# Patient Record
Sex: Female | Born: 1980 | Race: White | Hispanic: No | Marital: Single
Health system: Southern US, Community
[De-identification: ages and names within clinical notes are randomized; demographics above are authoritative.]

---

## 2004-03-02 ENCOUNTER — Inpatient Hospital Stay (HOSPITAL_COMMUNITY): Admission: EM | Admit: 2004-03-02 | Discharge: 2004-03-03 | Payer: Self-pay | Admitting: Emergency Medicine

## 2004-04-01 ENCOUNTER — Ambulatory Visit (HOSPITAL_COMMUNITY): Admission: RE | Admit: 2004-04-01 | Discharge: 2004-04-01 | Payer: Self-pay | Admitting: Internal Medicine

## 2004-07-25 ENCOUNTER — Emergency Department (HOSPITAL_COMMUNITY): Admission: EM | Admit: 2004-07-25 | Discharge: 2004-07-25 | Payer: Self-pay | Admitting: Emergency Medicine

## 2004-12-19 ENCOUNTER — Emergency Department (HOSPITAL_COMMUNITY): Admission: EM | Admit: 2004-12-19 | Discharge: 2004-12-19 | Payer: Self-pay | Admitting: Emergency Medicine

## 2004-12-21 ENCOUNTER — Ambulatory Visit (HOSPITAL_COMMUNITY): Admission: RE | Admit: 2004-12-21 | Discharge: 2004-12-21 | Payer: Self-pay | Admitting: Family Medicine

## 2005-04-26 ENCOUNTER — Observation Stay (HOSPITAL_COMMUNITY): Admission: RE | Admit: 2005-04-26 | Discharge: 2005-04-27 | Payer: Self-pay | Admitting: General Surgery

## 2006-04-21 IMAGING — CR DG ABDOMEN ACUTE W/ 1V CHEST
1 series · 1 of 1 positions shown · non-contrast
Comparison: None.
 ABDOMEN ACUTE WITH PA CHEST:

CLINICAL DATA: Abdominal pain, vomiting, not eating for three to five days.

[view not recorded]
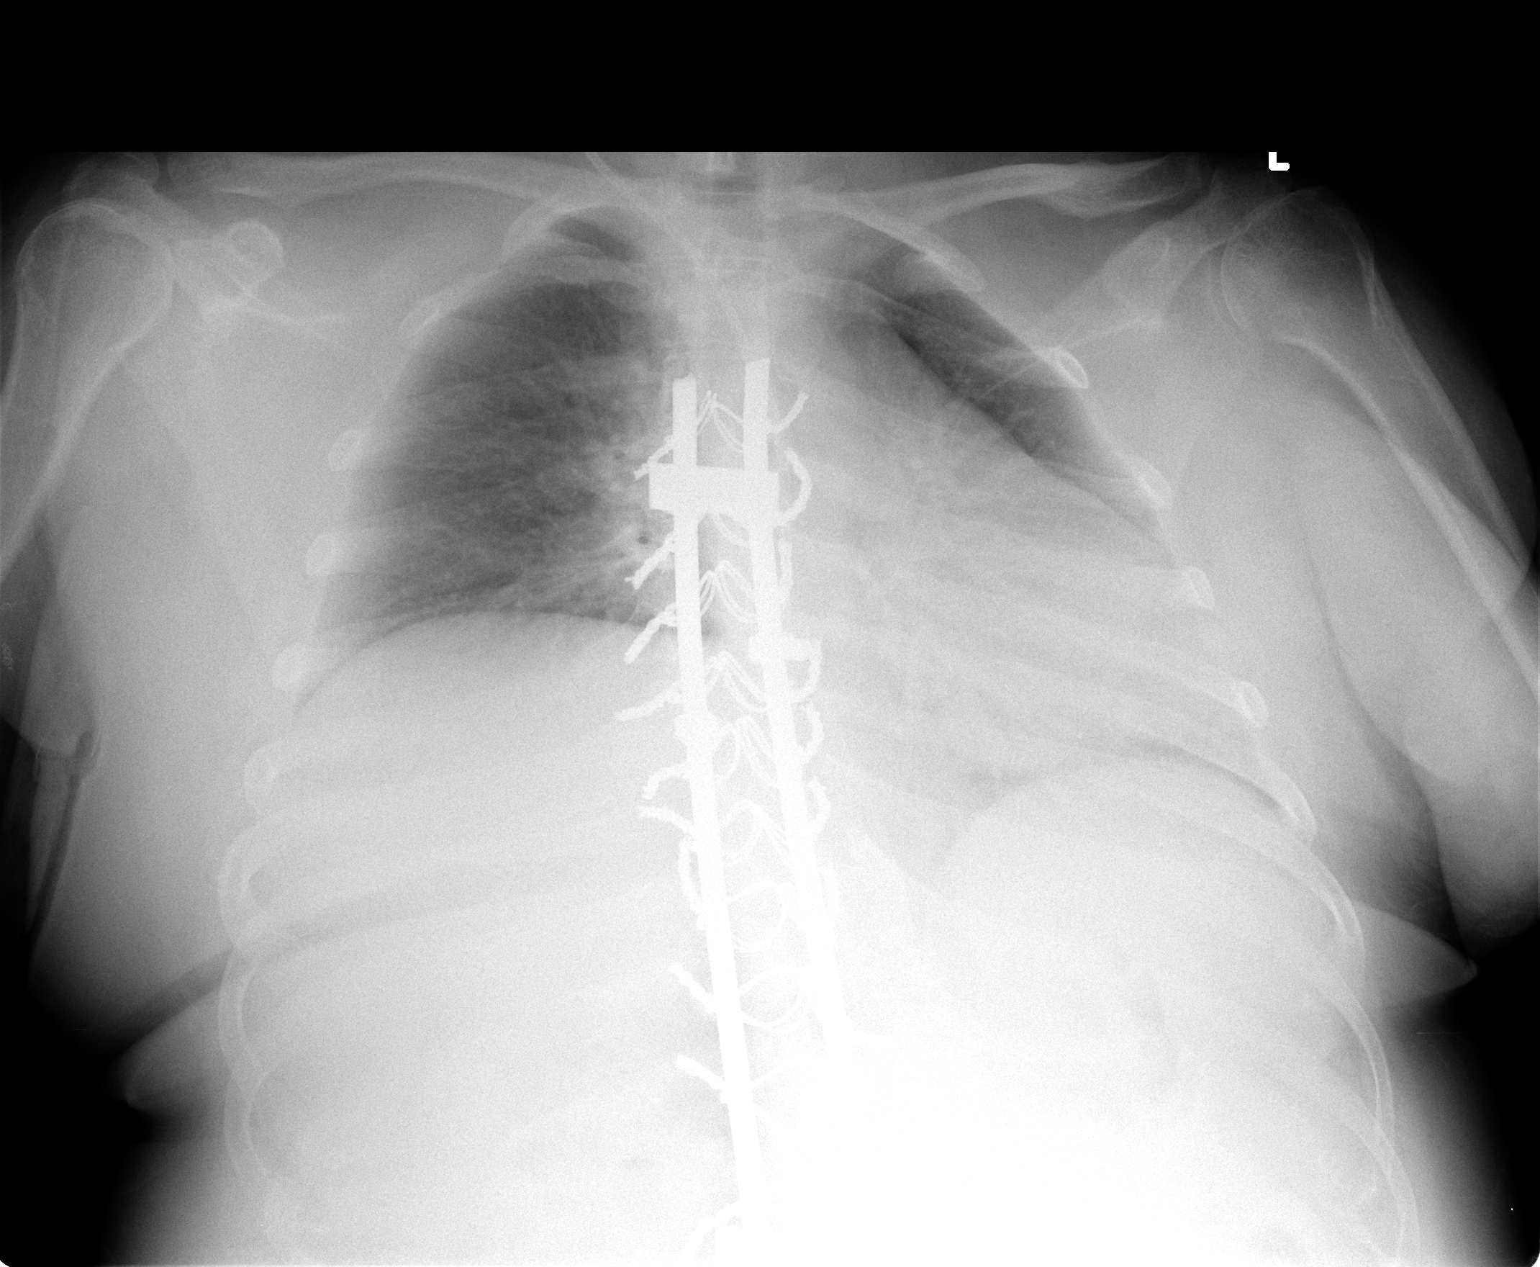

[1 of 1 positions shown; findings below may reference images not displayed]

FINDINGS: The heart size is enlarged.  There are no pleural effusions.  The intrathoracic portion of the ventriculoperitoneal shunt tubing is partially obscured by posterior Harrington rods.  
 The visualized portions of the shunt tubing overlying the upper chest and neck appear intact.  
 The bowel gas pattern appears non obstructive.  The intraabdominal portions of the peritoneal shunt tubing appear intact.  A large mass within the left hemi abdomen is again identified, most likely representing a CSF pseudocyst based on its position with respect to the shunt tip.  The bowel gas pattern is otherwise unremarkable.
IMPRESSION: No acute cardiopulmonary abnormalities.  Large mass in left hemipelvis, likely representing large CSF pseudocyst.

## 2006-04-23 IMAGING — US US ASPIRATION
1 series · 8 of 8 positions shown · non-contrast
Comparison: none

CLINICAL DATA: Recurrent low peritoneal collection near the tip of the VP shunt.

[Series 1: unknown · 0.37mm/px · 8 of 8 slices shown]
[im 1/8]
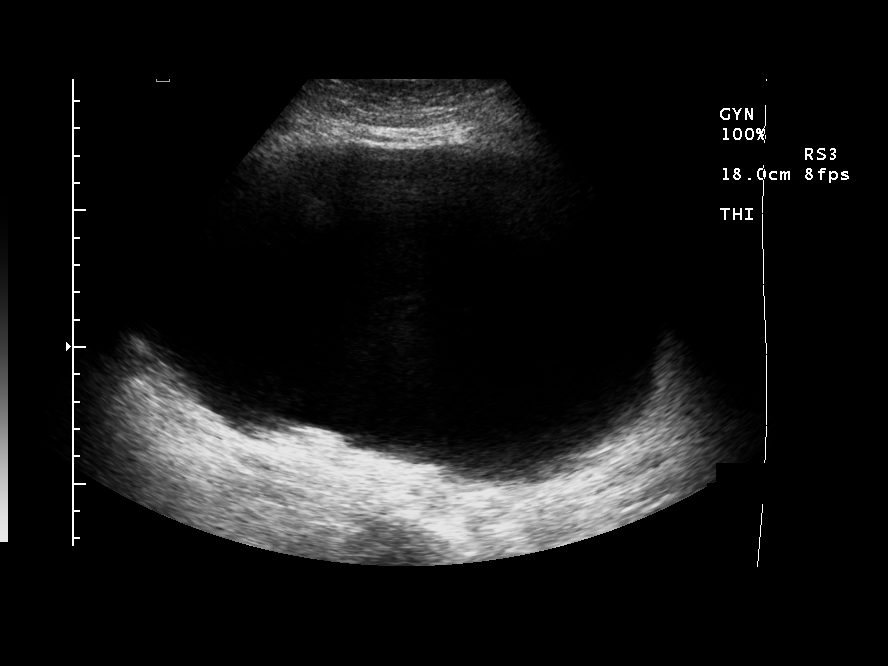
[im 2/8]
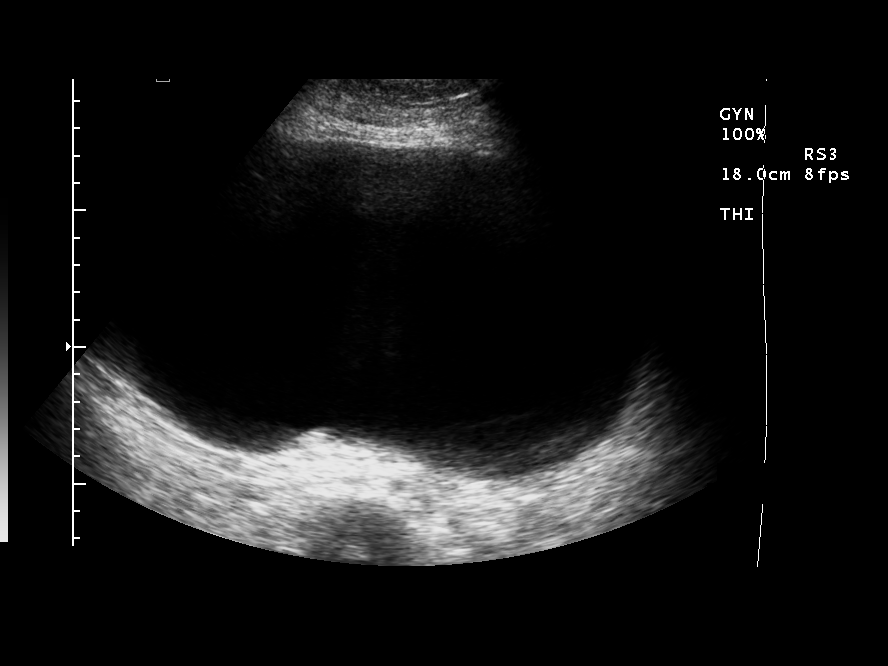
[im 3/8]
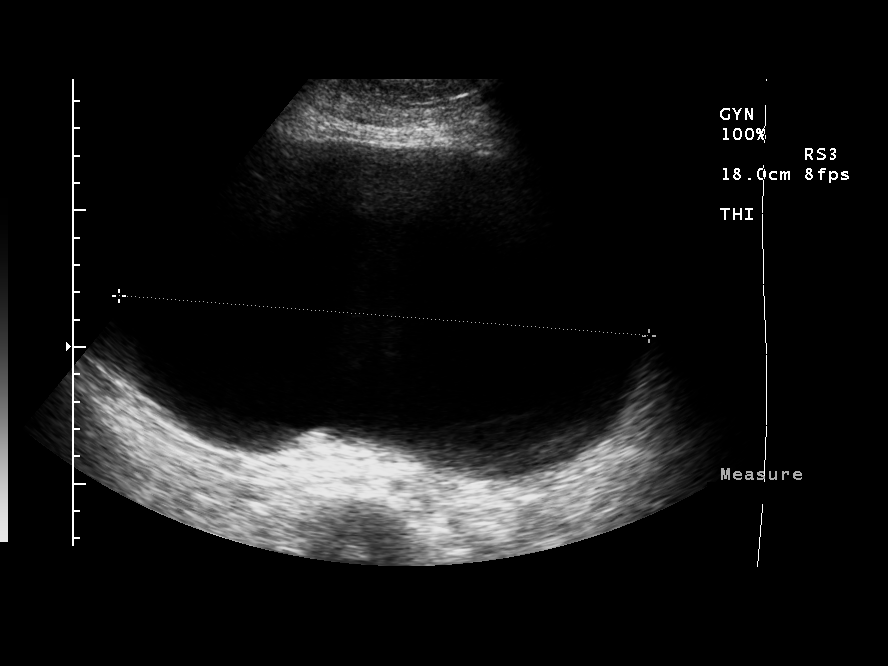
[im 4/8]
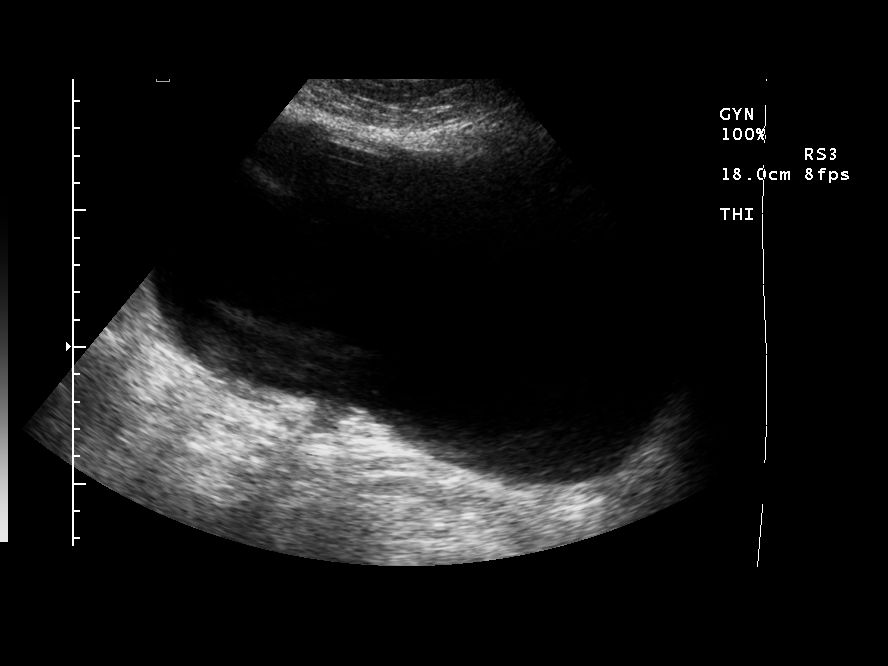
[im 5/8]
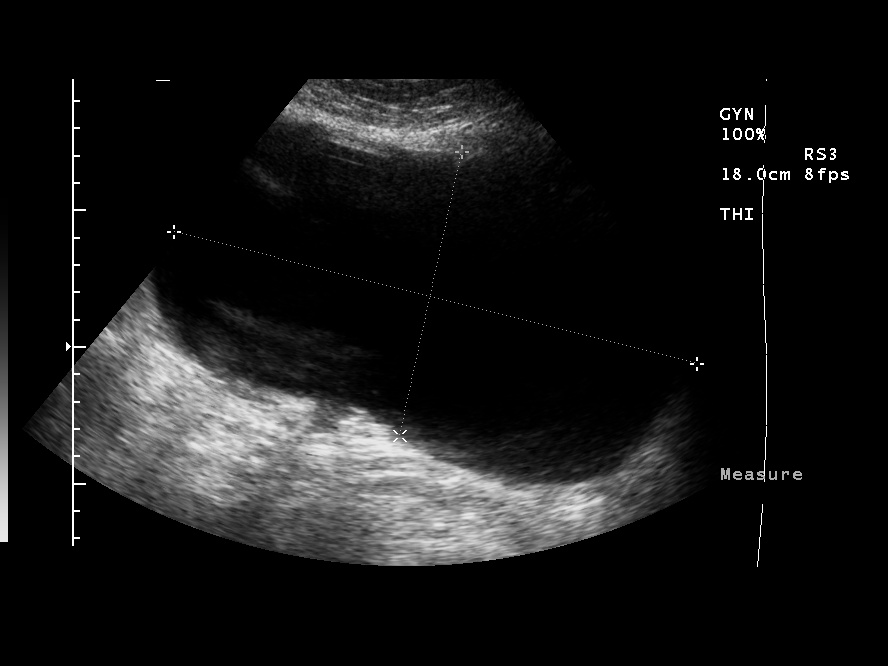
[im 6/8]
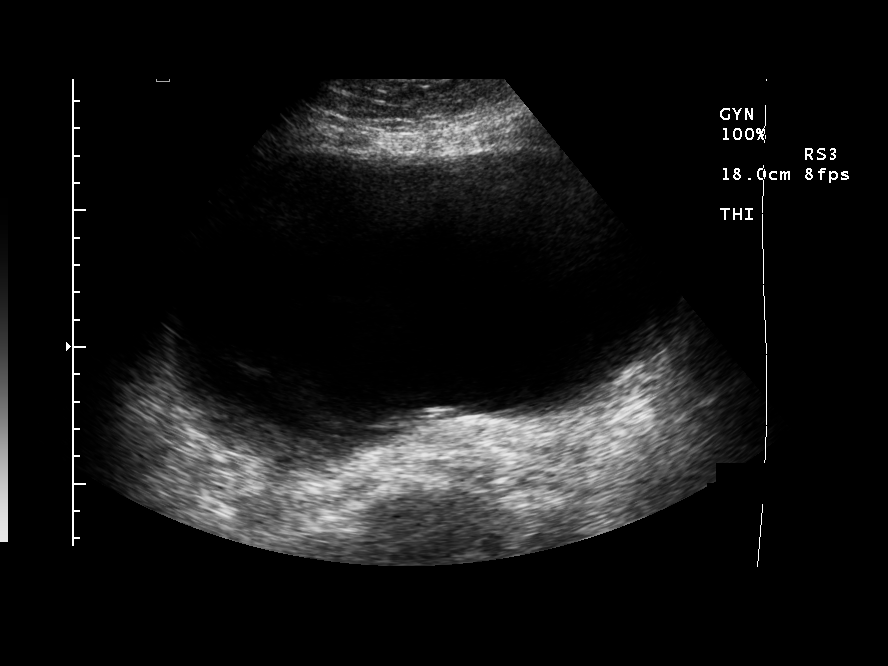
[im 7/8]
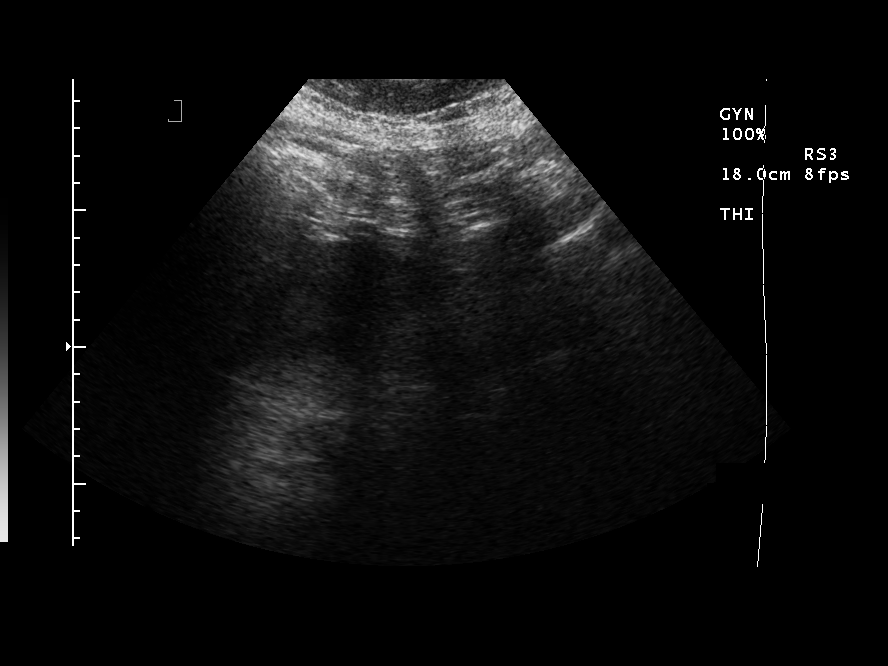
[im 8/8]
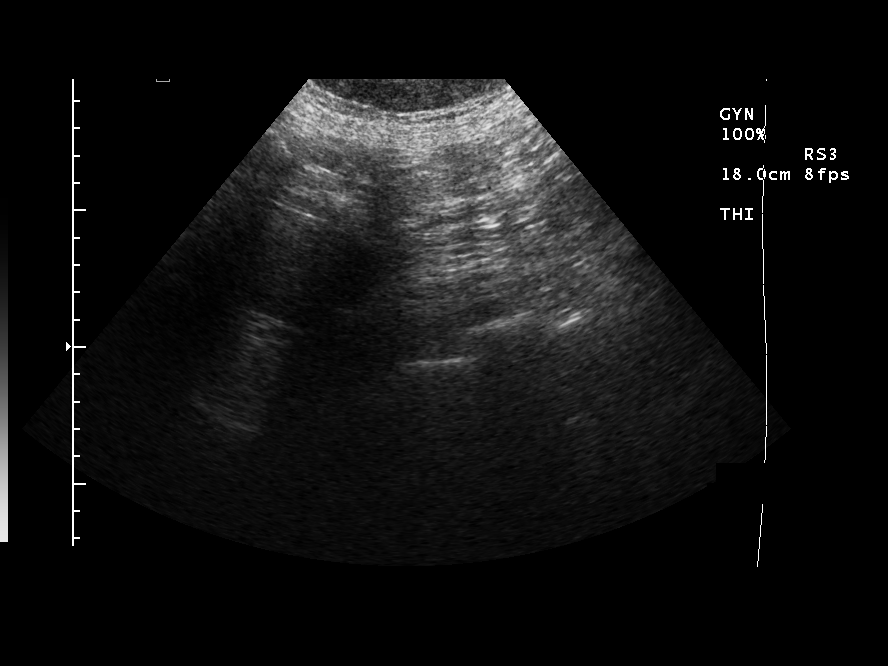

[8 of 8 positions shown; findings below may reference images not displayed]

Ultrasound guided peritoneal aspiration:

Survey ultrasound was performed and an appropriate skin entry site was
determined. Skin site was marked, prepped with Betadine, and draped in usual
sterile fashion, and infiltrated locally with 1% lidocaine.

A 5 Dipti Gaspar multisidehole sheath needle was advanced into the collection.
1.8 liters of clear fluid were aspirated, samples sent for the requested
laboratory studies. No immediate complication.
IMPRESSION: 1. Technically successful ultrasound guided aspiration of recurrent peritoneal
fluid collection

## 2010-04-25 ENCOUNTER — Encounter: Payer: Self-pay | Admitting: Family Medicine

## 2011-02-21 ENCOUNTER — Other Ambulatory Visit: Payer: Self-pay | Admitting: Family Medicine

## 2015-06-08 DIAGNOSIS — E785 Hyperlipidemia, unspecified: Secondary | ICD-10-CM | POA: Diagnosis not present

## 2015-06-30 DIAGNOSIS — G40219 Localization-related (focal) (partial) symptomatic epilepsy and epileptic syndromes with complex partial seizures, intractable, without status epilepticus: Secondary | ICD-10-CM | POA: Diagnosis not present

## 2015-06-30 DIAGNOSIS — R9401 Abnormal electroencephalogram [EEG]: Secondary | ICD-10-CM | POA: Diagnosis not present

## 2015-07-06 DIAGNOSIS — J320 Chronic maxillary sinusitis: Secondary | ICD-10-CM | POA: Diagnosis not present

## 2015-07-27 DIAGNOSIS — N200 Calculus of kidney: Secondary | ICD-10-CM | POA: Diagnosis not present

## 2015-07-27 DIAGNOSIS — G8250 Quadriplegia, unspecified: Secondary | ICD-10-CM | POA: Diagnosis not present

## 2015-08-17 DIAGNOSIS — H6692 Otitis media, unspecified, left ear: Secondary | ICD-10-CM | POA: Diagnosis not present

## 2015-10-25 DIAGNOSIS — J069 Acute upper respiratory infection, unspecified: Secondary | ICD-10-CM | POA: Diagnosis not present

## 2015-12-15 DIAGNOSIS — Z888 Allergy status to other drugs, medicaments and biological substances status: Secondary | ICD-10-CM | POA: Diagnosis not present

## 2015-12-15 DIAGNOSIS — R569 Unspecified convulsions: Secondary | ICD-10-CM | POA: Diagnosis not present

## 2015-12-15 DIAGNOSIS — Z79899 Other long term (current) drug therapy: Secondary | ICD-10-CM | POA: Diagnosis not present

## 2015-12-15 DIAGNOSIS — G40909 Epilepsy, unspecified, not intractable, without status epilepticus: Secondary | ICD-10-CM | POA: Diagnosis not present

## 2015-12-15 DIAGNOSIS — Z8542 Personal history of malignant neoplasm of other parts of uterus: Secondary | ICD-10-CM | POA: Diagnosis not present

## 2015-12-15 DIAGNOSIS — M62422 Contracture of muscle, left upper arm: Secondary | ICD-10-CM | POA: Diagnosis not present

## 2015-12-15 DIAGNOSIS — Z982 Presence of cerebrospinal fluid drainage device: Secondary | ICD-10-CM | POA: Diagnosis not present

## 2015-12-15 DIAGNOSIS — R0789 Other chest pain: Secondary | ICD-10-CM | POA: Diagnosis not present

## 2015-12-15 DIAGNOSIS — Z880 Allergy status to penicillin: Secondary | ICD-10-CM | POA: Diagnosis not present

## 2015-12-15 DIAGNOSIS — M419 Scoliosis, unspecified: Secondary | ICD-10-CM | POA: Diagnosis not present

## 2015-12-15 DIAGNOSIS — Z87820 Personal history of traumatic brain injury: Secondary | ICD-10-CM | POA: Diagnosis not present

## 2015-12-15 DIAGNOSIS — Z981 Arthrodesis status: Secondary | ICD-10-CM | POA: Diagnosis not present

## 2015-12-15 DIAGNOSIS — Z881 Allergy status to other antibiotic agents status: Secondary | ICD-10-CM | POA: Diagnosis not present

## 2015-12-15 DIAGNOSIS — Z7951 Long term (current) use of inhaled steroids: Secondary | ICD-10-CM | POA: Diagnosis not present

## 2015-12-15 DIAGNOSIS — Z993 Dependence on wheelchair: Secondary | ICD-10-CM | POA: Diagnosis not present

## 2015-12-15 DIAGNOSIS — R079 Chest pain, unspecified: Secondary | ICD-10-CM | POA: Diagnosis not present

## 2015-12-15 DIAGNOSIS — Z882 Allergy status to sulfonamides status: Secondary | ICD-10-CM | POA: Diagnosis not present

## 2015-12-26 DIAGNOSIS — Z23 Encounter for immunization: Secondary | ICD-10-CM | POA: Diagnosis not present

## 2016-02-11 DIAGNOSIS — G4733 Obstructive sleep apnea (adult) (pediatric): Secondary | ICD-10-CM | POA: Diagnosis not present

## 2016-05-31 ENCOUNTER — Ambulatory Visit: Payer: Self-pay | Admitting: Registered"

## 2017-06-27 DIAGNOSIS — G40219 Localization-related (focal) (partial) symptomatic epilepsy and epileptic syndromes with complex partial seizures, intractable, without status epilepticus: Secondary | ICD-10-CM | POA: Diagnosis not present

## 2018-02-16 ENCOUNTER — Ambulatory Visit (INDEPENDENT_AMBULATORY_CARE_PROVIDER_SITE_OTHER): Payer: Medicare Other

## 2018-02-16 ENCOUNTER — Other Ambulatory Visit: Payer: Self-pay | Admitting: Family Medicine

## 2018-02-16 DIAGNOSIS — R509 Fever, unspecified: Secondary | ICD-10-CM

## 2018-02-16 DIAGNOSIS — R059 Cough, unspecified: Secondary | ICD-10-CM

## 2018-02-16 DIAGNOSIS — R05 Cough: Secondary | ICD-10-CM

## 2019-06-19 IMAGING — DX DG CHEST 2V
2 series · 2 of 2 positions shown · non-contrast
Comparison: None.

CLINICAL DATA: Cough since [REDACTED], fever, low O2 sats.

EXAM:
CHEST - 2 VIEW

[chest lat]
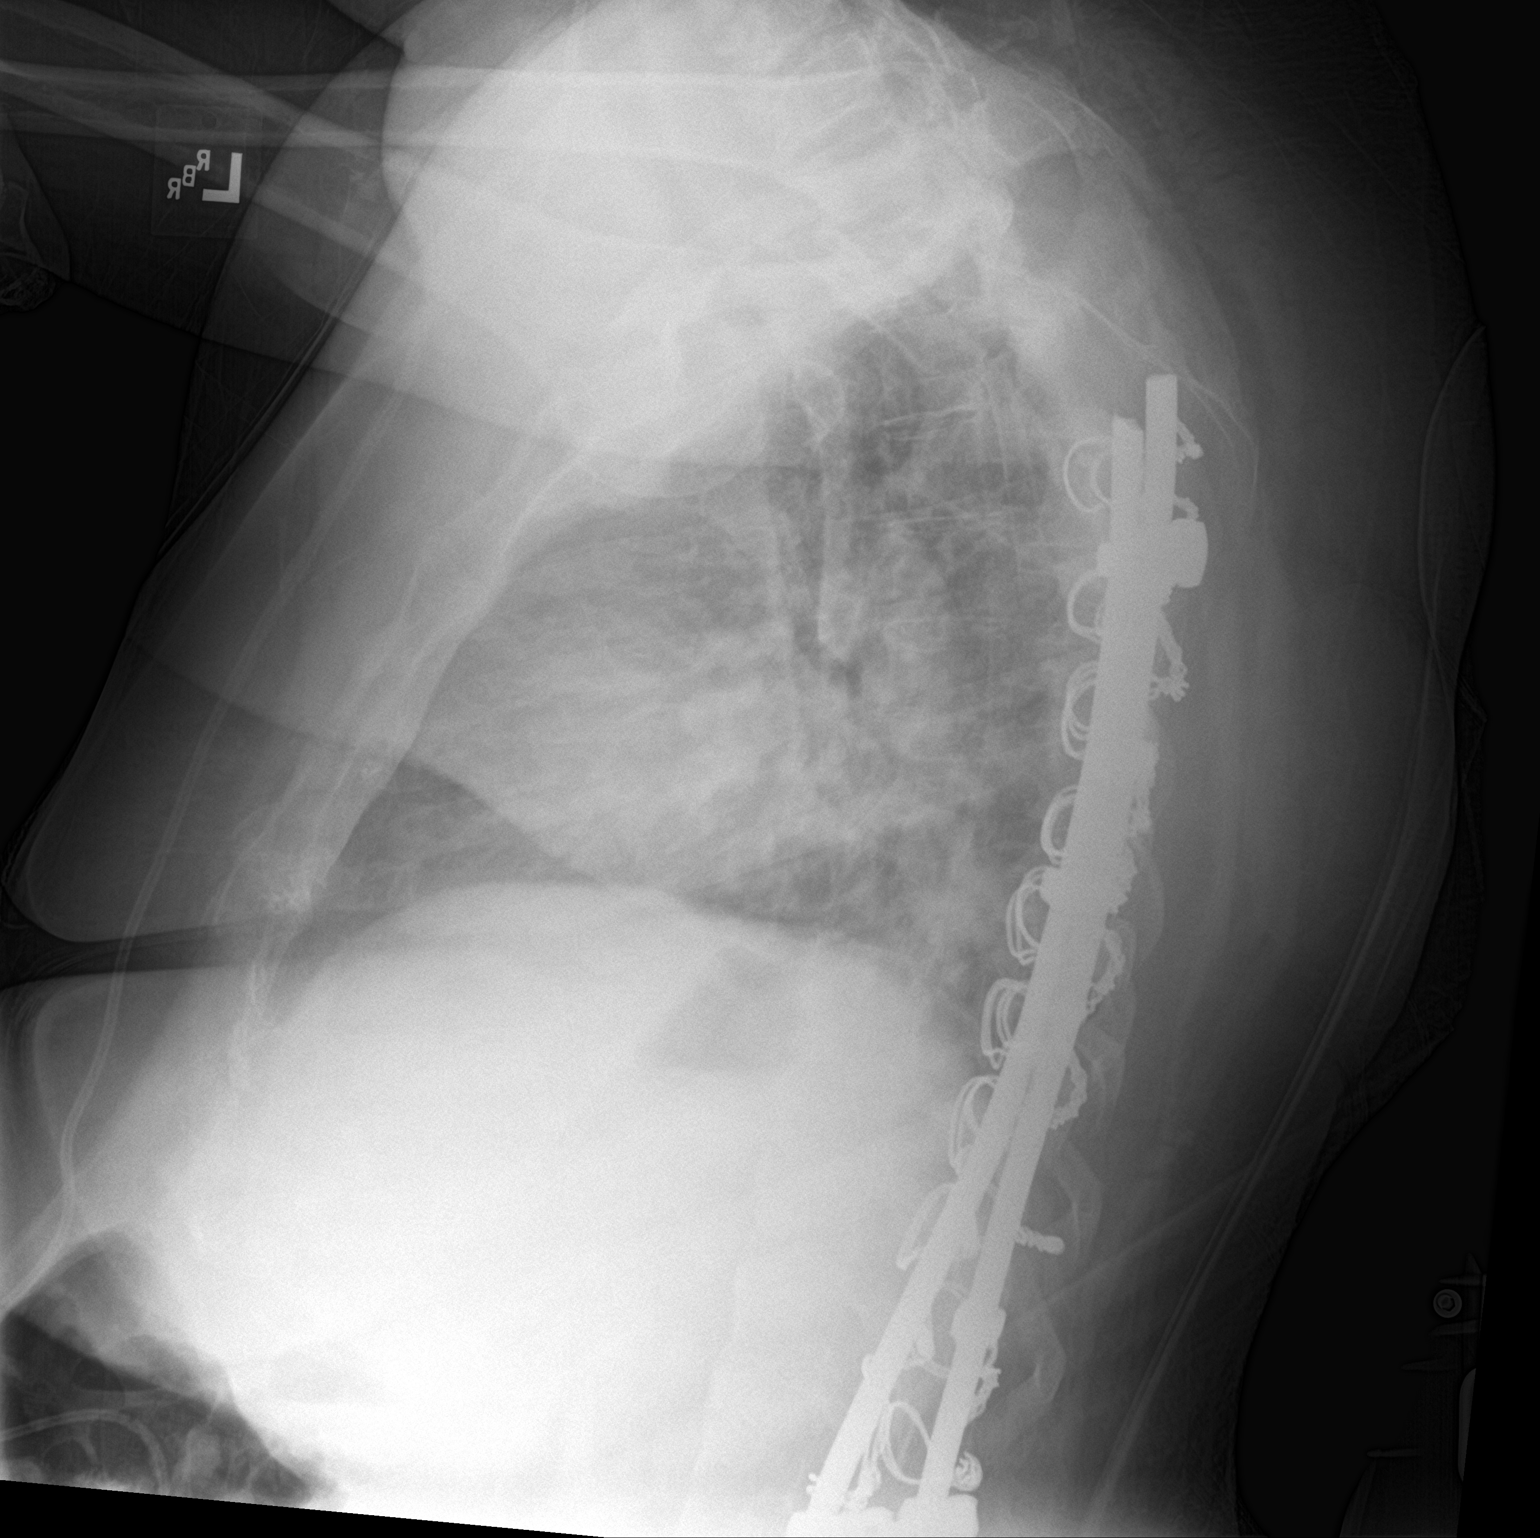

[chest ap]
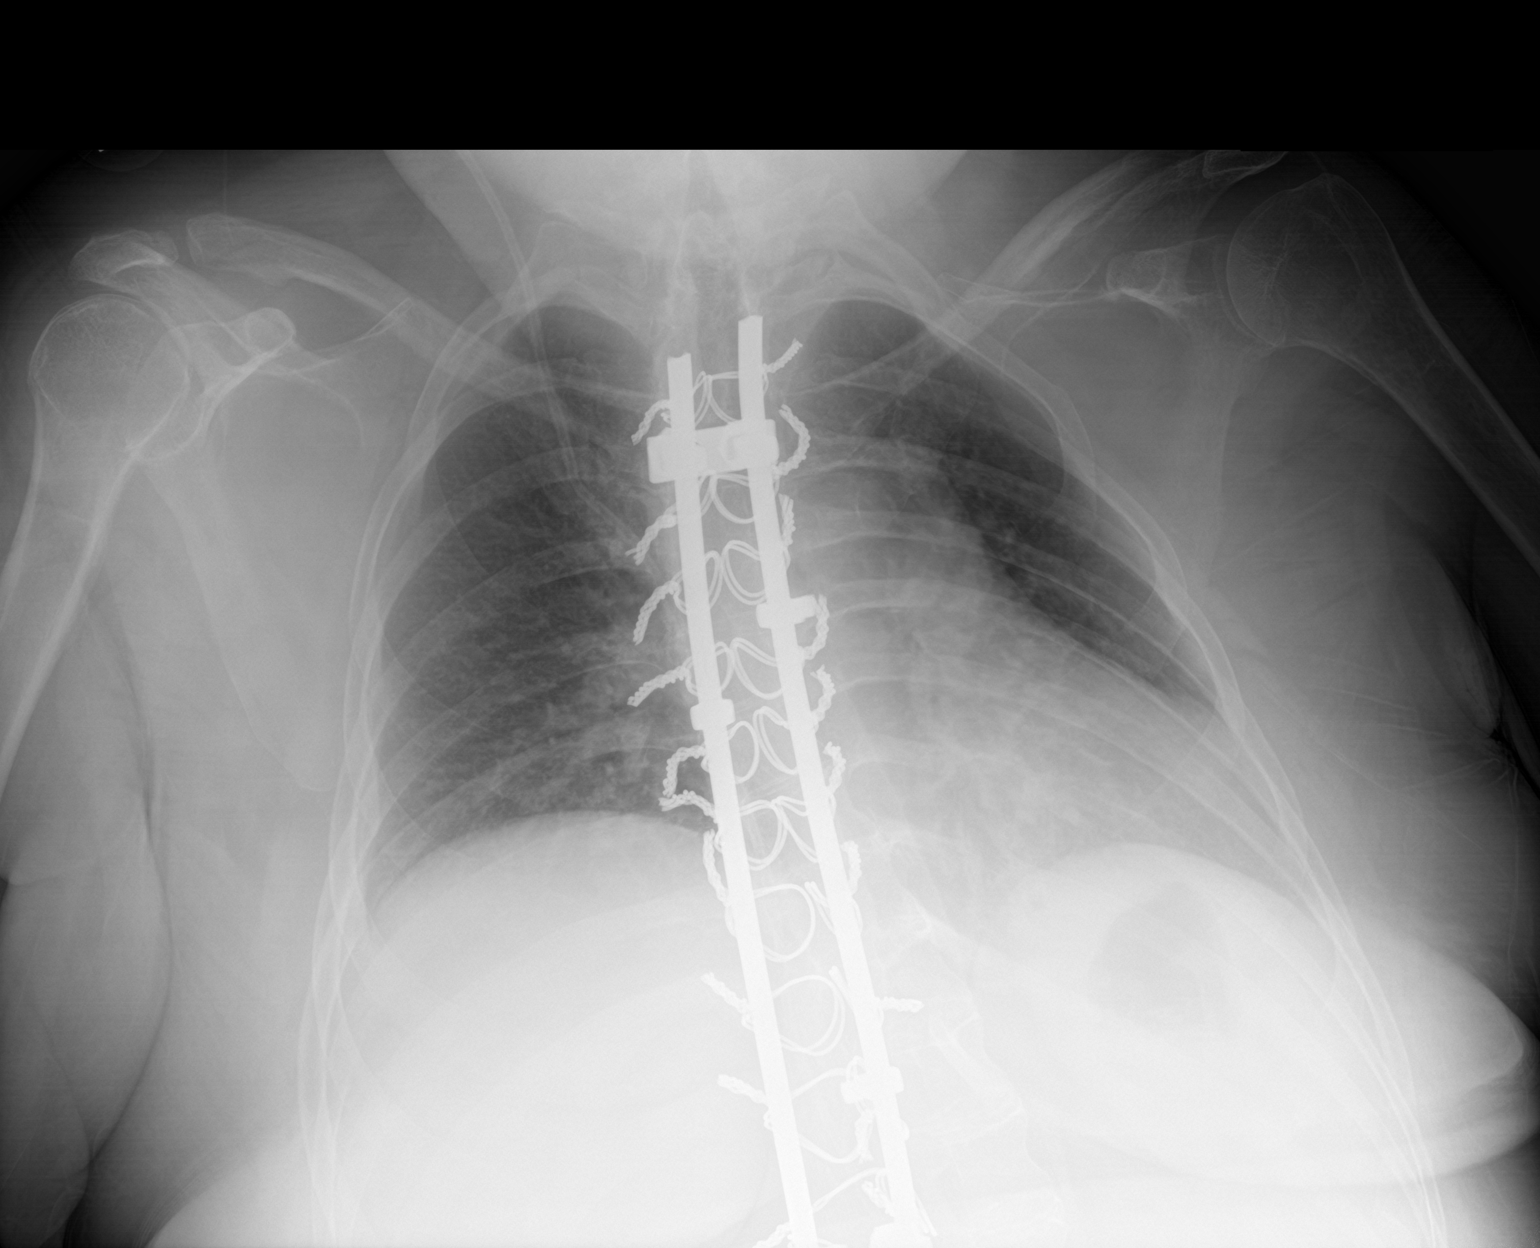

[2 of 2 positions shown; findings below may reference images not displayed]

FINDINGS: Borderline cardiomegaly, although heart size may be accentuated by
the patient's positioning within a wheelchair. Ill-defined opacity
within the retrocardiac space, as seen on the lateral projection,
highly suspicious for lower lobe pneumonia.
IMPRESSION: Retrocardiac opacity, highly suspicious for pneumonia.

## 2019-12-02 ENCOUNTER — Other Ambulatory Visit (HOSPITAL_COMMUNITY): Payer: Self-pay | Admitting: Oncology

## 2019-12-02 DIAGNOSIS — U071 COVID-19: Secondary | ICD-10-CM

## 2019-12-02 NOTE — Progress Notes (Signed)
I connected by phone with  Marisa Mendoza to discuss the potential use of an new treatment for mild to moderate COVID-19 viral infection in non-hospitalized patients.   This patient is a age/sex that meets the FDA criteria for Emergency Use Authorization of casirivimab\imdevimab.  Has a (+) direct SARS-CoV-2 viral test result 1. Has mild or moderate COVID-19  2. Is ? 38 years of age and weighs ? 40 kg 3. Is NOT hospitalized due to COVID-19 4. Is NOT requiring oxygen therapy or requiring an increase in baseline oxygen flow rate due to COVID-19 5. Is within 10 days of symptom onset 6. Has at least one of the high risk factor(s) for progression to severe COVID-19 and/or hospitalization as defined in EUA. ? Specific high risk criteria : Quadriplegia   Symptom onset  11/29/19   I have spoken and communicated the following to the patient or parent/caregiver:   1. FDA has authorized the emergency use of casirivimab\imdevimab for the treatment of mild to moderate COVID-19 in adults and pediatric patients with positive results of direct SARS-CoV-2 viral testing who are 57 years of age and older weighing at least 40 kg, and who are at high risk for progressing to severe COVID-19 and/or hospitalization.   2. The significant known and potential risks and benefits of casirivimab\imdevimab, and the extent to which such potential risks and benefits are unknown.   3. Information on available alternative treatments and the risks and benefits of those alternatives, including clinical trials.   4. Patients treated with casirivimab\imdevimab should continue to self-isolate and use infection control measures (e.g., wear mask, isolate, social distance, avoid sharing personal items, clean and disinfect "high touch" surfaces, and frequent handwashing) according to CDC guidelines.    5. The patient or parent/caregiver has the option to accept or refuse casirivimab\imdevimab .   After reviewing this information with the  patient, The patient agreed to proceed with receiving casirivimab\imdevimab infusion and will be provided a copy of the Fact sheet prior to receiving the infusion.Mignon Pine, AGNP-C [phone] (Infusion Center Hotline)

## 2019-12-03 ENCOUNTER — Ambulatory Visit (HOSPITAL_COMMUNITY)
Admission: RE | Admit: 2019-12-03 | Discharge: 2019-12-03 | Disposition: A | Discharge status: Home / Self Care | Payer: Medicare Other | Source: Ambulatory Visit | Attending: Pulmonary Disease | Admitting: Pulmonary Disease

## 2019-12-03 DIAGNOSIS — Z23 Encounter for immunization: Secondary | ICD-10-CM | POA: Insufficient documentation

## 2019-12-03 DIAGNOSIS — U071 COVID-19: Secondary | ICD-10-CM | POA: Diagnosis present

## 2019-12-03 MED ORDER — ALBUTEROL SULFATE HFA 108 (90 BASE) MCG/ACT IN AERS: 2.0000 | INHALATION_SPRAY | Freq: Once | RESPIRATORY_TRACT | Status: DC | PRN

## 2019-12-03 MED ORDER — SODIUM CHLORIDE 0.9 % IV SOLN
1200.0000 mg | Freq: Once | INTRAVENOUS | Status: AC
  Administered 2019-12-03: 1200 mg via INTRAVENOUS

## 2019-12-03 MED ORDER — FAMOTIDINE IN NACL 20-0.9 MG/50ML-% IV SOLN: 20.0000 mg | Freq: Once | INTRAVENOUS | Status: DC | PRN

## 2019-12-03 MED ORDER — DIPHENHYDRAMINE HCL 50 MG/ML IJ SOLN: 50.0000 mg | Freq: Once | INTRAMUSCULAR | Status: DC | PRN

## 2019-12-03 MED ORDER — METHYLPREDNISOLONE SODIUM SUCC 125 MG IJ SOLR: 125.0000 mg | Freq: Once | INTRAMUSCULAR | Status: DC | PRN

## 2019-12-03 MED ORDER — EPINEPHRINE 0.3 MG/0.3ML IJ SOAJ: 0.3000 mg | Freq: Once | INTRAMUSCULAR | Status: DC | PRN

## 2019-12-03 MED ORDER — SODIUM CHLORIDE 0.9 % IV SOLN: INTRAVENOUS | Status: DC | PRN

## 2019-12-03 NOTE — Discharge Instructions (Signed)

## 2019-12-03 NOTE — Progress Notes (Signed)
  Diagnosis: COVID-19  Physician: dr Delford Field   Procedure: [facility] Med: casirivimab\imdevimab infusion - Provided patient with casirivimab\imdevimab fact sheet for patients, parents and caregivers prior to infusion.  Complications: No immediate complications noted.  Discharge: Discharged home   Shaune Spittle 12/03/2019

## 2020-04-20 DIAGNOSIS — E1169 Type 2 diabetes mellitus with other specified complication: Secondary | ICD-10-CM | POA: Diagnosis not present

## 2020-04-20 DIAGNOSIS — E782 Mixed hyperlipidemia: Secondary | ICD-10-CM | POA: Diagnosis not present

## 2020-04-20 DIAGNOSIS — E785 Hyperlipidemia, unspecified: Secondary | ICD-10-CM | POA: Diagnosis not present

## 2020-04-22 DIAGNOSIS — G4733 Obstructive sleep apnea (adult) (pediatric): Secondary | ICD-10-CM | POA: Diagnosis not present

## 2020-05-11 DIAGNOSIS — G809 Cerebral palsy, unspecified: Secondary | ICD-10-CM | POA: Diagnosis not present

## 2020-05-11 DIAGNOSIS — G8250 Quadriplegia, unspecified: Secondary | ICD-10-CM | POA: Diagnosis not present

## 2020-06-03 DIAGNOSIS — E782 Mixed hyperlipidemia: Secondary | ICD-10-CM | POA: Diagnosis not present

## 2020-06-03 DIAGNOSIS — E785 Hyperlipidemia, unspecified: Secondary | ICD-10-CM | POA: Diagnosis not present

## 2020-06-03 DIAGNOSIS — E1169 Type 2 diabetes mellitus with other specified complication: Secondary | ICD-10-CM | POA: Diagnosis not present

## 2020-06-03 DIAGNOSIS — G809 Cerebral palsy, unspecified: Secondary | ICD-10-CM | POA: Diagnosis not present

## 2020-06-03 DIAGNOSIS — G8250 Quadriplegia, unspecified: Secondary | ICD-10-CM | POA: Diagnosis not present

## 2020-06-15 DIAGNOSIS — Z01812 Encounter for preprocedural laboratory examination: Secondary | ICD-10-CM | POA: Diagnosis not present

## 2020-06-15 DIAGNOSIS — Z20822 Contact with and (suspected) exposure to covid-19: Secondary | ICD-10-CM | POA: Diagnosis not present

## 2020-06-18 DIAGNOSIS — M1990 Unspecified osteoarthritis, unspecified site: Secondary | ICD-10-CM | POA: Diagnosis not present

## 2020-06-18 DIAGNOSIS — E109 Type 1 diabetes mellitus without complications: Secondary | ICD-10-CM | POA: Diagnosis not present

## 2020-06-18 DIAGNOSIS — R569 Unspecified convulsions: Secondary | ICD-10-CM | POA: Diagnosis not present

## 2020-06-18 DIAGNOSIS — G4733 Obstructive sleep apnea (adult) (pediatric): Secondary | ICD-10-CM | POA: Diagnosis not present

## 2020-06-18 DIAGNOSIS — K219 Gastro-esophageal reflux disease without esophagitis: Secondary | ICD-10-CM | POA: Diagnosis not present

## 2020-06-18 DIAGNOSIS — Z79899 Other long term (current) drug therapy: Secondary | ICD-10-CM | POA: Diagnosis not present

## 2020-06-18 DIAGNOSIS — E119 Type 2 diabetes mellitus without complications: Secondary | ICD-10-CM | POA: Diagnosis not present

## 2020-06-18 DIAGNOSIS — Z881 Allergy status to other antibiotic agents status: Secondary | ICD-10-CM | POA: Diagnosis not present

## 2020-06-18 DIAGNOSIS — E785 Hyperlipidemia, unspecified: Secondary | ICD-10-CM | POA: Diagnosis not present

## 2020-06-18 DIAGNOSIS — Z888 Allergy status to other drugs, medicaments and biological substances status: Secondary | ICD-10-CM | POA: Diagnosis not present

## 2020-06-18 DIAGNOSIS — K029 Dental caries, unspecified: Secondary | ICD-10-CM | POA: Diagnosis not present

## 2020-08-03 DIAGNOSIS — G8250 Quadriplegia, unspecified: Secondary | ICD-10-CM | POA: Diagnosis not present

## 2020-08-03 DIAGNOSIS — S06890A Other specified intracranial injury without loss of consciousness, initial encounter: Secondary | ICD-10-CM | POA: Diagnosis not present

## 2020-09-08 DIAGNOSIS — G8250 Quadriplegia, unspecified: Secondary | ICD-10-CM | POA: Diagnosis not present

## 2020-09-08 DIAGNOSIS — G809 Cerebral palsy, unspecified: Secondary | ICD-10-CM | POA: Diagnosis not present

## 2020-09-10 DIAGNOSIS — E785 Hyperlipidemia, unspecified: Secondary | ICD-10-CM | POA: Diagnosis not present

## 2020-09-10 DIAGNOSIS — E1169 Type 2 diabetes mellitus with other specified complication: Secondary | ICD-10-CM | POA: Diagnosis not present

## 2020-09-10 DIAGNOSIS — E782 Mixed hyperlipidemia: Secondary | ICD-10-CM | POA: Diagnosis not present

## 2020-10-01 DIAGNOSIS — S069X1S Unspecified intracranial injury with loss of consciousness of 30 minutes or less, sequela: Secondary | ICD-10-CM | POA: Diagnosis not present

## 2020-10-01 DIAGNOSIS — S06890A Other specified intracranial injury without loss of consciousness, initial encounter: Secondary | ICD-10-CM | POA: Diagnosis not present

## 2020-10-01 DIAGNOSIS — G8250 Quadriplegia, unspecified: Secondary | ICD-10-CM | POA: Diagnosis not present

## 2020-10-20 DIAGNOSIS — G4733 Obstructive sleep apnea (adult) (pediatric): Secondary | ICD-10-CM | POA: Diagnosis not present

## 2020-11-11 DIAGNOSIS — E1169 Type 2 diabetes mellitus with other specified complication: Secondary | ICD-10-CM | POA: Diagnosis not present

## 2020-11-11 DIAGNOSIS — E782 Mixed hyperlipidemia: Secondary | ICD-10-CM | POA: Diagnosis not present

## 2020-11-11 DIAGNOSIS — E785 Hyperlipidemia, unspecified: Secondary | ICD-10-CM | POA: Diagnosis not present

## 2021-01-18 DIAGNOSIS — E1169 Type 2 diabetes mellitus with other specified complication: Secondary | ICD-10-CM | POA: Diagnosis not present

## 2021-01-18 DIAGNOSIS — G4733 Obstructive sleep apnea (adult) (pediatric): Secondary | ICD-10-CM | POA: Diagnosis not present

## 2021-01-18 DIAGNOSIS — J309 Allergic rhinitis, unspecified: Secondary | ICD-10-CM | POA: Diagnosis not present

## 2021-01-18 DIAGNOSIS — E782 Mixed hyperlipidemia: Secondary | ICD-10-CM | POA: Diagnosis not present

## 2021-01-19 DIAGNOSIS — G4733 Obstructive sleep apnea (adult) (pediatric): Secondary | ICD-10-CM | POA: Diagnosis not present

## 2021-01-30 DIAGNOSIS — E785 Hyperlipidemia, unspecified: Secondary | ICD-10-CM | POA: Diagnosis not present

## 2021-01-30 DIAGNOSIS — E782 Mixed hyperlipidemia: Secondary | ICD-10-CM | POA: Diagnosis not present

## 2021-01-30 DIAGNOSIS — E1169 Type 2 diabetes mellitus with other specified complication: Secondary | ICD-10-CM | POA: Diagnosis not present

## 2021-02-10 DIAGNOSIS — E785 Hyperlipidemia, unspecified: Secondary | ICD-10-CM | POA: Diagnosis not present

## 2021-02-10 DIAGNOSIS — E782 Mixed hyperlipidemia: Secondary | ICD-10-CM | POA: Diagnosis not present

## 2021-02-10 DIAGNOSIS — E1169 Type 2 diabetes mellitus with other specified complication: Secondary | ICD-10-CM | POA: Diagnosis not present

## 2021-02-24 DIAGNOSIS — G4733 Obstructive sleep apnea (adult) (pediatric): Secondary | ICD-10-CM | POA: Diagnosis not present

## 2021-02-24 DIAGNOSIS — S069X9S Unspecified intracranial injury with loss of consciousness of unspecified duration, sequela: Secondary | ICD-10-CM | POA: Diagnosis not present

## 2021-02-24 DIAGNOSIS — E1169 Type 2 diabetes mellitus with other specified complication: Secondary | ICD-10-CM | POA: Diagnosis not present

## 2021-02-24 DIAGNOSIS — G809 Cerebral palsy, unspecified: Secondary | ICD-10-CM | POA: Diagnosis not present

## 2021-02-24 DIAGNOSIS — G8250 Quadriplegia, unspecified: Secondary | ICD-10-CM | POA: Diagnosis not present

## 2021-03-01 DIAGNOSIS — H6503 Acute serous otitis media, bilateral: Secondary | ICD-10-CM | POA: Diagnosis not present

## 2021-03-01 DIAGNOSIS — R509 Fever, unspecified: Secondary | ICD-10-CM | POA: Diagnosis not present

## 2021-03-01 DIAGNOSIS — J069 Acute upper respiratory infection, unspecified: Secondary | ICD-10-CM | POA: Diagnosis not present

## 2021-03-01 DIAGNOSIS — U071 COVID-19: Secondary | ICD-10-CM | POA: Diagnosis not present

## 2021-03-01 DIAGNOSIS — R519 Headache, unspecified: Secondary | ICD-10-CM | POA: Diagnosis not present

## 2021-03-18 DIAGNOSIS — E785 Hyperlipidemia, unspecified: Secondary | ICD-10-CM | POA: Diagnosis not present

## 2021-03-18 DIAGNOSIS — M25512 Pain in left shoulder: Secondary | ICD-10-CM | POA: Diagnosis not present

## 2021-03-18 DIAGNOSIS — Z882 Allergy status to sulfonamides status: Secondary | ICD-10-CM | POA: Diagnosis not present

## 2021-03-18 DIAGNOSIS — E119 Type 2 diabetes mellitus without complications: Secondary | ICD-10-CM | POA: Diagnosis not present

## 2021-03-18 DIAGNOSIS — L231 Allergic contact dermatitis due to adhesives: Secondary | ICD-10-CM | POA: Diagnosis not present

## 2021-03-18 DIAGNOSIS — Z888 Allergy status to other drugs, medicaments and biological substances status: Secondary | ICD-10-CM | POA: Diagnosis not present

## 2021-03-18 DIAGNOSIS — R079 Chest pain, unspecified: Secondary | ICD-10-CM | POA: Diagnosis not present

## 2021-03-18 DIAGNOSIS — Z79899 Other long term (current) drug therapy: Secondary | ICD-10-CM | POA: Diagnosis not present

## 2021-03-18 DIAGNOSIS — Z881 Allergy status to other antibiotic agents status: Secondary | ICD-10-CM | POA: Diagnosis not present

## 2021-03-18 DIAGNOSIS — R0789 Other chest pain: Secondary | ICD-10-CM | POA: Diagnosis not present

## 2021-03-18 DIAGNOSIS — I517 Cardiomegaly: Secondary | ICD-10-CM | POA: Diagnosis not present

## 2021-03-18 DIAGNOSIS — S4992XA Unspecified injury of left shoulder and upper arm, initial encounter: Secondary | ICD-10-CM | POA: Diagnosis not present

## 2021-03-19 DIAGNOSIS — E785 Hyperlipidemia, unspecified: Secondary | ICD-10-CM | POA: Diagnosis not present

## 2021-03-19 DIAGNOSIS — R9431 Abnormal electrocardiogram [ECG] [EKG]: Secondary | ICD-10-CM | POA: Diagnosis not present

## 2021-03-19 DIAGNOSIS — E782 Mixed hyperlipidemia: Secondary | ICD-10-CM | POA: Diagnosis not present

## 2021-03-19 DIAGNOSIS — E1169 Type 2 diabetes mellitus with other specified complication: Secondary | ICD-10-CM | POA: Diagnosis not present

## 2021-03-22 DIAGNOSIS — R569 Unspecified convulsions: Secondary | ICD-10-CM | POA: Diagnosis not present

## 2021-03-22 DIAGNOSIS — M25512 Pain in left shoulder: Secondary | ICD-10-CM | POA: Diagnosis not present

## 2021-03-22 DIAGNOSIS — S069X9S Unspecified intracranial injury with loss of consciousness of unspecified duration, sequela: Secondary | ICD-10-CM | POA: Diagnosis not present

## 2021-04-20 DIAGNOSIS — M25612 Stiffness of left shoulder, not elsewhere classified: Secondary | ICD-10-CM | POA: Diagnosis not present

## 2021-04-20 DIAGNOSIS — M25512 Pain in left shoulder: Secondary | ICD-10-CM | POA: Diagnosis not present

## 2021-04-20 DIAGNOSIS — M6281 Muscle weakness (generalized): Secondary | ICD-10-CM | POA: Diagnosis not present

## 2021-04-20 DIAGNOSIS — G4733 Obstructive sleep apnea (adult) (pediatric): Secondary | ICD-10-CM | POA: Diagnosis not present

## 2021-05-04 DIAGNOSIS — M25512 Pain in left shoulder: Secondary | ICD-10-CM | POA: Diagnosis not present

## 2021-05-04 DIAGNOSIS — R29898 Other symptoms and signs involving the musculoskeletal system: Secondary | ICD-10-CM | POA: Diagnosis not present

## 2021-05-04 DIAGNOSIS — M25612 Stiffness of left shoulder, not elsewhere classified: Secondary | ICD-10-CM | POA: Diagnosis not present

## 2021-05-11 DIAGNOSIS — M25612 Stiffness of left shoulder, not elsewhere classified: Secondary | ICD-10-CM | POA: Diagnosis not present

## 2021-05-11 DIAGNOSIS — M25512 Pain in left shoulder: Secondary | ICD-10-CM | POA: Diagnosis not present

## 2021-05-11 DIAGNOSIS — M6281 Muscle weakness (generalized): Secondary | ICD-10-CM | POA: Diagnosis not present

## 2021-05-11 DIAGNOSIS — R6889 Other general symptoms and signs: Secondary | ICD-10-CM | POA: Diagnosis not present

## 2021-05-11 DIAGNOSIS — Z87820 Personal history of traumatic brain injury: Secondary | ICD-10-CM | POA: Diagnosis not present

## 2021-05-18 DIAGNOSIS — Z87820 Personal history of traumatic brain injury: Secondary | ICD-10-CM | POA: Diagnosis not present

## 2021-05-18 DIAGNOSIS — M25612 Stiffness of left shoulder, not elsewhere classified: Secondary | ICD-10-CM | POA: Diagnosis not present

## 2021-05-18 DIAGNOSIS — M25512 Pain in left shoulder: Secondary | ICD-10-CM | POA: Diagnosis not present

## 2021-05-18 DIAGNOSIS — R6889 Other general symptoms and signs: Secondary | ICD-10-CM | POA: Diagnosis not present

## 2021-05-18 DIAGNOSIS — M6281 Muscle weakness (generalized): Secondary | ICD-10-CM | POA: Diagnosis not present

## 2021-05-25 DIAGNOSIS — M25512 Pain in left shoulder: Secondary | ICD-10-CM | POA: Diagnosis not present

## 2021-05-25 DIAGNOSIS — Z87820 Personal history of traumatic brain injury: Secondary | ICD-10-CM | POA: Diagnosis not present

## 2021-05-25 DIAGNOSIS — M25612 Stiffness of left shoulder, not elsewhere classified: Secondary | ICD-10-CM | POA: Diagnosis not present

## 2021-05-25 DIAGNOSIS — R6889 Other general symptoms and signs: Secondary | ICD-10-CM | POA: Diagnosis not present

## 2021-05-25 DIAGNOSIS — M6281 Muscle weakness (generalized): Secondary | ICD-10-CM | POA: Diagnosis not present

## 2021-05-31 DIAGNOSIS — G8250 Quadriplegia, unspecified: Secondary | ICD-10-CM | POA: Diagnosis not present

## 2021-05-31 DIAGNOSIS — E1169 Type 2 diabetes mellitus with other specified complication: Secondary | ICD-10-CM | POA: Diagnosis not present

## 2021-05-31 DIAGNOSIS — G809 Cerebral palsy, unspecified: Secondary | ICD-10-CM | POA: Diagnosis not present

## 2021-06-08 DIAGNOSIS — M542 Cervicalgia: Secondary | ICD-10-CM | POA: Diagnosis not present

## 2021-06-08 DIAGNOSIS — R531 Weakness: Secondary | ICD-10-CM | POA: Diagnosis not present

## 2021-06-08 DIAGNOSIS — M25512 Pain in left shoulder: Secondary | ICD-10-CM | POA: Diagnosis not present

## 2021-06-08 DIAGNOSIS — R2689 Other abnormalities of gait and mobility: Secondary | ICD-10-CM | POA: Diagnosis not present

## 2021-06-15 DIAGNOSIS — R2689 Other abnormalities of gait and mobility: Secondary | ICD-10-CM | POA: Diagnosis not present

## 2021-06-15 DIAGNOSIS — R531 Weakness: Secondary | ICD-10-CM | POA: Diagnosis not present

## 2021-06-15 DIAGNOSIS — M25512 Pain in left shoulder: Secondary | ICD-10-CM | POA: Diagnosis not present

## 2021-06-15 DIAGNOSIS — M542 Cervicalgia: Secondary | ICD-10-CM | POA: Diagnosis not present

## 2021-06-22 DIAGNOSIS — R531 Weakness: Secondary | ICD-10-CM | POA: Diagnosis not present

## 2021-06-22 DIAGNOSIS — R2689 Other abnormalities of gait and mobility: Secondary | ICD-10-CM | POA: Diagnosis not present

## 2021-06-22 DIAGNOSIS — M25512 Pain in left shoulder: Secondary | ICD-10-CM | POA: Diagnosis not present

## 2021-06-22 DIAGNOSIS — M542 Cervicalgia: Secondary | ICD-10-CM | POA: Diagnosis not present

## 2021-06-23 DIAGNOSIS — R300 Dysuria: Secondary | ICD-10-CM | POA: Diagnosis not present

## 2021-06-29 DIAGNOSIS — M6281 Muscle weakness (generalized): Secondary | ICD-10-CM | POA: Diagnosis not present

## 2021-06-29 DIAGNOSIS — M25612 Stiffness of left shoulder, not elsewhere classified: Secondary | ICD-10-CM | POA: Diagnosis not present

## 2021-06-29 DIAGNOSIS — M25512 Pain in left shoulder: Secondary | ICD-10-CM | POA: Diagnosis not present

## 2021-07-19 DIAGNOSIS — G4733 Obstructive sleep apnea (adult) (pediatric): Secondary | ICD-10-CM | POA: Diagnosis not present

## 2021-07-23 DIAGNOSIS — E1169 Type 2 diabetes mellitus with other specified complication: Secondary | ICD-10-CM | POA: Diagnosis not present

## 2021-07-23 DIAGNOSIS — Z0000 Encounter for general adult medical examination without abnormal findings: Secondary | ICD-10-CM | POA: Diagnosis not present

## 2021-08-26 DIAGNOSIS — G8250 Quadriplegia, unspecified: Secondary | ICD-10-CM | POA: Diagnosis not present

## 2021-08-26 DIAGNOSIS — G809 Cerebral palsy, unspecified: Secondary | ICD-10-CM | POA: Diagnosis not present

## 2021-09-03 DIAGNOSIS — S06890A Other specified intracranial injury without loss of consciousness, initial encounter: Secondary | ICD-10-CM | POA: Diagnosis not present

## 2021-09-03 DIAGNOSIS — G8250 Quadriplegia, unspecified: Secondary | ICD-10-CM | POA: Diagnosis not present

## 2021-09-14 DIAGNOSIS — Z1231 Encounter for screening mammogram for malignant neoplasm of breast: Secondary | ICD-10-CM | POA: Diagnosis not present

## 2021-10-21 DIAGNOSIS — E1169 Type 2 diabetes mellitus with other specified complication: Secondary | ICD-10-CM | POA: Diagnosis not present

## 2021-10-22 DIAGNOSIS — G4733 Obstructive sleep apnea (adult) (pediatric): Secondary | ICD-10-CM | POA: Diagnosis not present

## 2021-10-27 DIAGNOSIS — G8250 Quadriplegia, unspecified: Secondary | ICD-10-CM | POA: Diagnosis not present

## 2021-12-02 DIAGNOSIS — G809 Cerebral palsy, unspecified: Secondary | ICD-10-CM | POA: Diagnosis not present

## 2021-12-02 DIAGNOSIS — G8250 Quadriplegia, unspecified: Secondary | ICD-10-CM | POA: Diagnosis not present

## 2021-12-02 DIAGNOSIS — S069X9S Unspecified intracranial injury with loss of consciousness of unspecified duration, sequela: Secondary | ICD-10-CM | POA: Diagnosis not present

## 2022-01-20 DIAGNOSIS — G4733 Obstructive sleep apnea (adult) (pediatric): Secondary | ICD-10-CM | POA: Diagnosis not present

## 2022-02-07 DIAGNOSIS — E1169 Type 2 diabetes mellitus with other specified complication: Secondary | ICD-10-CM | POA: Diagnosis not present

## 2022-02-07 DIAGNOSIS — J309 Allergic rhinitis, unspecified: Secondary | ICD-10-CM | POA: Diagnosis not present

## 2022-02-07 DIAGNOSIS — J019 Acute sinusitis, unspecified: Secondary | ICD-10-CM | POA: Diagnosis not present

## 2022-02-07 DIAGNOSIS — G4733 Obstructive sleep apnea (adult) (pediatric): Secondary | ICD-10-CM | POA: Diagnosis not present

## 2022-02-07 DIAGNOSIS — G8250 Quadriplegia, unspecified: Secondary | ICD-10-CM | POA: Diagnosis not present

## 2022-02-07 DIAGNOSIS — B9689 Other specified bacterial agents as the cause of diseases classified elsewhere: Secondary | ICD-10-CM | POA: Diagnosis not present

## 2022-02-07 DIAGNOSIS — E785 Hyperlipidemia, unspecified: Secondary | ICD-10-CM | POA: Diagnosis not present

## 2022-02-08 DIAGNOSIS — G4733 Obstructive sleep apnea (adult) (pediatric): Secondary | ICD-10-CM | POA: Diagnosis not present

## 2022-02-15 DIAGNOSIS — G4733 Obstructive sleep apnea (adult) (pediatric): Secondary | ICD-10-CM | POA: Diagnosis not present

## 2022-04-04 DIAGNOSIS — G4733 Obstructive sleep apnea (adult) (pediatric): Secondary | ICD-10-CM | POA: Diagnosis not present

## 2022-04-07 DIAGNOSIS — G8250 Quadriplegia, unspecified: Secondary | ICD-10-CM | POA: Diagnosis not present

## 2022-04-07 DIAGNOSIS — S06890A Other specified intracranial injury without loss of consciousness, initial encounter: Secondary | ICD-10-CM | POA: Diagnosis not present

## 2022-04-13 DIAGNOSIS — J029 Acute pharyngitis, unspecified: Secondary | ICD-10-CM | POA: Diagnosis not present

## 2022-04-13 DIAGNOSIS — Z03818 Encounter for observation for suspected exposure to other biological agents ruled out: Secondary | ICD-10-CM | POA: Diagnosis not present

## 2022-04-13 DIAGNOSIS — H66002 Acute suppurative otitis media without spontaneous rupture of ear drum, left ear: Secondary | ICD-10-CM | POA: Diagnosis not present

## 2022-04-13 DIAGNOSIS — R21 Rash and other nonspecific skin eruption: Secondary | ICD-10-CM | POA: Diagnosis not present

## 2022-04-13 DIAGNOSIS — J069 Acute upper respiratory infection, unspecified: Secondary | ICD-10-CM | POA: Diagnosis not present

## 2022-04-13 DIAGNOSIS — R051 Acute cough: Secondary | ICD-10-CM | POA: Diagnosis not present

## 2022-04-20 DIAGNOSIS — G4733 Obstructive sleep apnea (adult) (pediatric): Secondary | ICD-10-CM | POA: Diagnosis not present

## 2022-05-03 DIAGNOSIS — G809 Cerebral palsy, unspecified: Secondary | ICD-10-CM | POA: Diagnosis not present

## 2022-05-03 DIAGNOSIS — G8250 Quadriplegia, unspecified: Secondary | ICD-10-CM | POA: Diagnosis not present

## 2022-05-03 DIAGNOSIS — R251 Tremor, unspecified: Secondary | ICD-10-CM | POA: Diagnosis not present

## 2022-05-03 DIAGNOSIS — E1169 Type 2 diabetes mellitus with other specified complication: Secondary | ICD-10-CM | POA: Diagnosis not present

## 2022-05-03 DIAGNOSIS — S069X9S Unspecified intracranial injury with loss of consciousness of unspecified duration, sequela: Secondary | ICD-10-CM | POA: Diagnosis not present

## 2022-05-18 DIAGNOSIS — G4733 Obstructive sleep apnea (adult) (pediatric): Secondary | ICD-10-CM | POA: Diagnosis not present

## 2022-05-19 DIAGNOSIS — G4733 Obstructive sleep apnea (adult) (pediatric): Secondary | ICD-10-CM | POA: Diagnosis not present

## 2022-05-23 DIAGNOSIS — J069 Acute upper respiratory infection, unspecified: Secondary | ICD-10-CM | POA: Diagnosis not present

## 2022-05-23 DIAGNOSIS — Z20822 Contact with and (suspected) exposure to covid-19: Secondary | ICD-10-CM | POA: Diagnosis not present

## 2022-06-16 DIAGNOSIS — G4733 Obstructive sleep apnea (adult) (pediatric): Secondary | ICD-10-CM | POA: Diagnosis not present

## 2022-06-20 DIAGNOSIS — J029 Acute pharyngitis, unspecified: Secondary | ICD-10-CM | POA: Diagnosis not present

## 2022-06-20 DIAGNOSIS — U071 COVID-19: Secondary | ICD-10-CM | POA: Diagnosis not present

## 2022-07-17 DIAGNOSIS — G4733 Obstructive sleep apnea (adult) (pediatric): Secondary | ICD-10-CM | POA: Diagnosis not present

## 2022-07-19 DIAGNOSIS — G4733 Obstructive sleep apnea (adult) (pediatric): Secondary | ICD-10-CM | POA: Diagnosis not present

## 2022-08-12 DIAGNOSIS — S06890A Other specified intracranial injury without loss of consciousness, initial encounter: Secondary | ICD-10-CM | POA: Diagnosis not present

## 2022-08-12 DIAGNOSIS — G8250 Quadriplegia, unspecified: Secondary | ICD-10-CM | POA: Diagnosis not present

## 2022-08-12 DIAGNOSIS — Z23 Encounter for immunization: Secondary | ICD-10-CM | POA: Diagnosis not present

## 2022-08-12 DIAGNOSIS — G4733 Obstructive sleep apnea (adult) (pediatric): Secondary | ICD-10-CM | POA: Diagnosis not present

## 2022-08-12 DIAGNOSIS — E785 Hyperlipidemia, unspecified: Secondary | ICD-10-CM | POA: Diagnosis not present

## 2022-08-12 DIAGNOSIS — E119 Type 2 diabetes mellitus without complications: Secondary | ICD-10-CM | POA: Diagnosis not present

## 2022-08-12 DIAGNOSIS — J309 Allergic rhinitis, unspecified: Secondary | ICD-10-CM | POA: Diagnosis not present

## 2022-08-12 DIAGNOSIS — E1169 Type 2 diabetes mellitus with other specified complication: Secondary | ICD-10-CM | POA: Diagnosis not present

## 2022-08-12 DIAGNOSIS — Z0000 Encounter for general adult medical examination without abnormal findings: Secondary | ICD-10-CM | POA: Diagnosis not present

## 2022-08-16 DIAGNOSIS — G4733 Obstructive sleep apnea (adult) (pediatric): Secondary | ICD-10-CM | POA: Diagnosis not present

## 2022-09-16 DIAGNOSIS — G4733 Obstructive sleep apnea (adult) (pediatric): Secondary | ICD-10-CM | POA: Diagnosis not present

## 2022-09-23 DIAGNOSIS — Z1231 Encounter for screening mammogram for malignant neoplasm of breast: Secondary | ICD-10-CM | POA: Diagnosis not present

## 2022-10-16 DIAGNOSIS — G4733 Obstructive sleep apnea (adult) (pediatric): Secondary | ICD-10-CM | POA: Diagnosis not present

## 2022-10-17 DIAGNOSIS — G4733 Obstructive sleep apnea (adult) (pediatric): Secondary | ICD-10-CM | POA: Diagnosis not present

## 2022-10-18 DIAGNOSIS — Z1239 Encounter for other screening for malignant neoplasm of breast: Secondary | ICD-10-CM | POA: Diagnosis not present

## 2022-10-18 DIAGNOSIS — N6001 Solitary cyst of right breast: Secondary | ICD-10-CM | POA: Diagnosis not present

## 2022-10-18 DIAGNOSIS — N6002 Solitary cyst of left breast: Secondary | ICD-10-CM | POA: Diagnosis not present

## 2022-11-07 DIAGNOSIS — G8250 Quadriplegia, unspecified: Secondary | ICD-10-CM | POA: Diagnosis not present

## 2022-11-07 DIAGNOSIS — G809 Cerebral palsy, unspecified: Secondary | ICD-10-CM | POA: Diagnosis not present

## 2022-11-10 DIAGNOSIS — G8250 Quadriplegia, unspecified: Secondary | ICD-10-CM | POA: Diagnosis not present

## 2022-11-10 DIAGNOSIS — E1169 Type 2 diabetes mellitus with other specified complication: Secondary | ICD-10-CM | POA: Diagnosis not present

## 2022-11-10 DIAGNOSIS — G4733 Obstructive sleep apnea (adult) (pediatric): Secondary | ICD-10-CM | POA: Diagnosis not present

## 2022-11-10 DIAGNOSIS — S069X9S Unspecified intracranial injury with loss of consciousness of unspecified duration, sequela: Secondary | ICD-10-CM | POA: Diagnosis not present

## 2022-11-14 DIAGNOSIS — H6690 Otitis media, unspecified, unspecified ear: Secondary | ICD-10-CM | POA: Diagnosis not present

## 2022-11-14 DIAGNOSIS — J069 Acute upper respiratory infection, unspecified: Secondary | ICD-10-CM | POA: Diagnosis not present

## 2022-11-14 DIAGNOSIS — Z03818 Encounter for observation for suspected exposure to other biological agents ruled out: Secondary | ICD-10-CM | POA: Diagnosis not present

## 2022-11-16 DIAGNOSIS — G4733 Obstructive sleep apnea (adult) (pediatric): Secondary | ICD-10-CM | POA: Diagnosis not present

## 2022-11-22 DIAGNOSIS — J069 Acute upper respiratory infection, unspecified: Secondary | ICD-10-CM | POA: Diagnosis not present

## 2022-11-24 DIAGNOSIS — H52223 Regular astigmatism, bilateral: Secondary | ICD-10-CM | POA: Diagnosis not present

## 2022-11-24 DIAGNOSIS — E119 Type 2 diabetes mellitus without complications: Secondary | ICD-10-CM | POA: Diagnosis not present

## 2022-12-08 DIAGNOSIS — G809 Cerebral palsy, unspecified: Secondary | ICD-10-CM | POA: Diagnosis not present

## 2022-12-08 DIAGNOSIS — G8250 Quadriplegia, unspecified: Secondary | ICD-10-CM | POA: Diagnosis not present

## 2022-12-17 DIAGNOSIS — G4733 Obstructive sleep apnea (adult) (pediatric): Secondary | ICD-10-CM | POA: Diagnosis not present

## 2022-12-21 DIAGNOSIS — G8250 Quadriplegia, unspecified: Secondary | ICD-10-CM | POA: Diagnosis not present

## 2022-12-31 DIAGNOSIS — G8250 Quadriplegia, unspecified: Secondary | ICD-10-CM | POA: Diagnosis not present

## 2022-12-31 DIAGNOSIS — S06890A Other specified intracranial injury without loss of consciousness, initial encounter: Secondary | ICD-10-CM | POA: Diagnosis not present

## 2023-01-05 DIAGNOSIS — H6993 Unspecified Eustachian tube disorder, bilateral: Secondary | ICD-10-CM | POA: Diagnosis not present

## 2023-01-06 DIAGNOSIS — G8250 Quadriplegia, unspecified: Secondary | ICD-10-CM | POA: Diagnosis not present

## 2023-01-06 DIAGNOSIS — G809 Cerebral palsy, unspecified: Secondary | ICD-10-CM | POA: Diagnosis not present

## 2023-01-16 DIAGNOSIS — G4733 Obstructive sleep apnea (adult) (pediatric): Secondary | ICD-10-CM | POA: Diagnosis not present

## 2023-01-21 DIAGNOSIS — G8250 Quadriplegia, unspecified: Secondary | ICD-10-CM | POA: Diagnosis not present

## 2023-02-07 DIAGNOSIS — G8250 Quadriplegia, unspecified: Secondary | ICD-10-CM | POA: Diagnosis not present

## 2023-02-07 DIAGNOSIS — G809 Cerebral palsy, unspecified: Secondary | ICD-10-CM | POA: Diagnosis not present

## 2023-02-15 DIAGNOSIS — E785 Hyperlipidemia, unspecified: Secondary | ICD-10-CM | POA: Diagnosis not present

## 2023-02-15 DIAGNOSIS — G4733 Obstructive sleep apnea (adult) (pediatric): Secondary | ICD-10-CM | POA: Diagnosis not present

## 2023-02-15 DIAGNOSIS — E1169 Type 2 diabetes mellitus with other specified complication: Secondary | ICD-10-CM | POA: Diagnosis not present

## 2023-02-15 DIAGNOSIS — Z993 Dependence on wheelchair: Secondary | ICD-10-CM | POA: Diagnosis not present

## 2023-02-15 DIAGNOSIS — H6502 Acute serous otitis media, left ear: Secondary | ICD-10-CM | POA: Diagnosis not present

## 2023-02-15 DIAGNOSIS — G8250 Quadriplegia, unspecified: Secondary | ICD-10-CM | POA: Diagnosis not present

## 2023-02-15 DIAGNOSIS — J309 Allergic rhinitis, unspecified: Secondary | ICD-10-CM | POA: Diagnosis not present

## 2023-02-15 DIAGNOSIS — E119 Type 2 diabetes mellitus without complications: Secondary | ICD-10-CM | POA: Diagnosis not present

## 2023-02-15 DIAGNOSIS — Z23 Encounter for immunization: Secondary | ICD-10-CM | POA: Diagnosis not present

## 2023-02-16 DIAGNOSIS — G4733 Obstructive sleep apnea (adult) (pediatric): Secondary | ICD-10-CM | POA: Diagnosis not present

## 2023-03-01 DIAGNOSIS — G8250 Quadriplegia, unspecified: Secondary | ICD-10-CM | POA: Diagnosis not present

## 2023-03-03 DIAGNOSIS — G8250 Quadriplegia, unspecified: Secondary | ICD-10-CM | POA: Diagnosis not present

## 2023-03-08 DIAGNOSIS — G809 Cerebral palsy, unspecified: Secondary | ICD-10-CM | POA: Diagnosis not present

## 2023-03-08 DIAGNOSIS — G8250 Quadriplegia, unspecified: Secondary | ICD-10-CM | POA: Diagnosis not present

## 2023-03-18 DIAGNOSIS — G4733 Obstructive sleep apnea (adult) (pediatric): Secondary | ICD-10-CM | POA: Diagnosis not present

## 2023-04-03 DIAGNOSIS — G8250 Quadriplegia, unspecified: Secondary | ICD-10-CM | POA: Diagnosis not present

## 2023-04-10 DIAGNOSIS — G8250 Quadriplegia, unspecified: Secondary | ICD-10-CM | POA: Diagnosis not present

## 2023-04-10 DIAGNOSIS — G809 Cerebral palsy, unspecified: Secondary | ICD-10-CM | POA: Diagnosis not present

## 2023-04-17 DIAGNOSIS — G4733 Obstructive sleep apnea (adult) (pediatric): Secondary | ICD-10-CM | POA: Diagnosis not present

## 2023-05-04 DIAGNOSIS — G8250 Quadriplegia, unspecified: Secondary | ICD-10-CM | POA: Diagnosis not present

## 2023-05-09 DIAGNOSIS — E1169 Type 2 diabetes mellitus with other specified complication: Secondary | ICD-10-CM | POA: Diagnosis not present

## 2023-05-09 DIAGNOSIS — G809 Cerebral palsy, unspecified: Secondary | ICD-10-CM | POA: Diagnosis not present

## 2023-05-09 DIAGNOSIS — G4733 Obstructive sleep apnea (adult) (pediatric): Secondary | ICD-10-CM | POA: Diagnosis not present

## 2023-05-10 DIAGNOSIS — G809 Cerebral palsy, unspecified: Secondary | ICD-10-CM | POA: Diagnosis not present

## 2023-05-10 DIAGNOSIS — G8250 Quadriplegia, unspecified: Secondary | ICD-10-CM | POA: Diagnosis not present

## 2023-06-03 DIAGNOSIS — G4733 Obstructive sleep apnea (adult) (pediatric): Secondary | ICD-10-CM | POA: Diagnosis not present

## 2023-06-07 DIAGNOSIS — G8250 Quadriplegia, unspecified: Secondary | ICD-10-CM | POA: Diagnosis not present

## 2023-06-07 DIAGNOSIS — G809 Cerebral palsy, unspecified: Secondary | ICD-10-CM | POA: Diagnosis not present

## 2023-07-07 DIAGNOSIS — G809 Cerebral palsy, unspecified: Secondary | ICD-10-CM | POA: Diagnosis not present

## 2023-07-07 DIAGNOSIS — G8250 Quadriplegia, unspecified: Secondary | ICD-10-CM | POA: Diagnosis not present

## 2023-07-10 DIAGNOSIS — G809 Cerebral palsy, unspecified: Secondary | ICD-10-CM | POA: Diagnosis not present

## 2023-07-10 DIAGNOSIS — G8250 Quadriplegia, unspecified: Secondary | ICD-10-CM | POA: Diagnosis not present

## 2023-07-16 DIAGNOSIS — G4733 Obstructive sleep apnea (adult) (pediatric): Secondary | ICD-10-CM | POA: Diagnosis not present

## 2023-07-17 DIAGNOSIS — R1010 Upper abdominal pain, unspecified: Secondary | ICD-10-CM | POA: Diagnosis not present

## 2023-07-17 DIAGNOSIS — G4733 Obstructive sleep apnea (adult) (pediatric): Secondary | ICD-10-CM | POA: Diagnosis not present

## 2023-07-19 DIAGNOSIS — K8020 Calculus of gallbladder without cholecystitis without obstruction: Secondary | ICD-10-CM | POA: Diagnosis not present

## 2023-07-19 DIAGNOSIS — N3000 Acute cystitis without hematuria: Secondary | ICD-10-CM | POA: Diagnosis not present

## 2023-07-19 DIAGNOSIS — E876 Hypokalemia: Secondary | ICD-10-CM | POA: Diagnosis not present

## 2023-07-19 DIAGNOSIS — Z87891 Personal history of nicotine dependence: Secondary | ICD-10-CM | POA: Diagnosis not present

## 2023-07-19 DIAGNOSIS — N3289 Other specified disorders of bladder: Secondary | ICD-10-CM | POA: Diagnosis not present

## 2023-07-19 DIAGNOSIS — N200 Calculus of kidney: Secondary | ICD-10-CM | POA: Diagnosis not present

## 2023-07-19 DIAGNOSIS — N281 Cyst of kidney, acquired: Secondary | ICD-10-CM | POA: Diagnosis not present

## 2023-07-19 DIAGNOSIS — E119 Type 2 diabetes mellitus without complications: Secondary | ICD-10-CM | POA: Diagnosis not present

## 2023-07-19 DIAGNOSIS — Z7984 Long term (current) use of oral hypoglycemic drugs: Secondary | ICD-10-CM | POA: Diagnosis not present

## 2023-07-19 DIAGNOSIS — R12 Heartburn: Secondary | ICD-10-CM | POA: Diagnosis not present

## 2023-07-19 DIAGNOSIS — R9431 Abnormal electrocardiogram [ECG] [EKG]: Secondary | ICD-10-CM | POA: Diagnosis not present

## 2023-07-25 DIAGNOSIS — K828 Other specified diseases of gallbladder: Secondary | ICD-10-CM | POA: Diagnosis not present

## 2023-07-25 DIAGNOSIS — N390 Urinary tract infection, site not specified: Secondary | ICD-10-CM | POA: Diagnosis not present

## 2023-07-25 DIAGNOSIS — K219 Gastro-esophageal reflux disease without esophagitis: Secondary | ICD-10-CM | POA: Diagnosis not present

## 2023-07-25 DIAGNOSIS — R1010 Upper abdominal pain, unspecified: Secondary | ICD-10-CM | POA: Diagnosis not present

## 2023-08-08 DIAGNOSIS — G8250 Quadriplegia, unspecified: Secondary | ICD-10-CM | POA: Diagnosis not present

## 2023-08-08 DIAGNOSIS — G809 Cerebral palsy, unspecified: Secondary | ICD-10-CM | POA: Diagnosis not present

## 2023-08-25 DIAGNOSIS — K5900 Constipation, unspecified: Secondary | ICD-10-CM | POA: Diagnosis not present

## 2023-08-25 DIAGNOSIS — R1013 Epigastric pain: Secondary | ICD-10-CM | POA: Diagnosis not present

## 2023-08-29 DIAGNOSIS — G4733 Obstructive sleep apnea (adult) (pediatric): Secondary | ICD-10-CM | POA: Diagnosis not present

## 2023-08-29 DIAGNOSIS — R030 Elevated blood-pressure reading, without diagnosis of hypertension: Secondary | ICD-10-CM | POA: Diagnosis not present

## 2023-08-29 DIAGNOSIS — E1169 Type 2 diabetes mellitus with other specified complication: Secondary | ICD-10-CM | POA: Diagnosis not present

## 2023-08-29 DIAGNOSIS — H9202 Otalgia, left ear: Secondary | ICD-10-CM | POA: Diagnosis not present

## 2023-08-29 DIAGNOSIS — G8250 Quadriplegia, unspecified: Secondary | ICD-10-CM | POA: Diagnosis not present

## 2023-08-29 DIAGNOSIS — K219 Gastro-esophageal reflux disease without esophagitis: Secondary | ICD-10-CM | POA: Diagnosis not present

## 2023-08-29 DIAGNOSIS — E782 Mixed hyperlipidemia: Secondary | ICD-10-CM | POA: Diagnosis not present

## 2023-08-29 DIAGNOSIS — K828 Other specified diseases of gallbladder: Secondary | ICD-10-CM | POA: Diagnosis not present

## 2023-08-30 DIAGNOSIS — R1013 Epigastric pain: Secondary | ICD-10-CM | POA: Diagnosis not present

## 2023-09-03 DIAGNOSIS — G4733 Obstructive sleep apnea (adult) (pediatric): Secondary | ICD-10-CM | POA: Diagnosis not present

## 2023-09-04 DIAGNOSIS — S06890A Other specified intracranial injury without loss of consciousness, initial encounter: Secondary | ICD-10-CM | POA: Diagnosis not present

## 2023-09-04 DIAGNOSIS — G8250 Quadriplegia, unspecified: Secondary | ICD-10-CM | POA: Diagnosis not present

## 2023-09-06 DIAGNOSIS — G809 Cerebral palsy, unspecified: Secondary | ICD-10-CM | POA: Diagnosis not present

## 2023-09-06 DIAGNOSIS — G8250 Quadriplegia, unspecified: Secondary | ICD-10-CM | POA: Diagnosis not present

## 2023-10-05 DIAGNOSIS — G8250 Quadriplegia, unspecified: Secondary | ICD-10-CM | POA: Diagnosis not present

## 2023-10-05 DIAGNOSIS — G809 Cerebral palsy, unspecified: Secondary | ICD-10-CM | POA: Diagnosis not present

## 2023-10-16 DIAGNOSIS — G4733 Obstructive sleep apnea (adult) (pediatric): Secondary | ICD-10-CM | POA: Diagnosis not present

## 2023-10-19 DIAGNOSIS — N6325 Unspecified lump in the left breast, overlapping quadrants: Secondary | ICD-10-CM | POA: Diagnosis not present

## 2023-10-19 DIAGNOSIS — Z803 Family history of malignant neoplasm of breast: Secondary | ICD-10-CM | POA: Diagnosis not present

## 2023-10-19 DIAGNOSIS — N6321 Unspecified lump in the left breast, upper outer quadrant: Secondary | ICD-10-CM | POA: Diagnosis not present

## 2023-10-25 DIAGNOSIS — R1013 Epigastric pain: Secondary | ICD-10-CM | POA: Diagnosis not present

## 2023-10-31 DIAGNOSIS — K219 Gastro-esophageal reflux disease without esophagitis: Secondary | ICD-10-CM | POA: Diagnosis not present

## 2023-10-31 DIAGNOSIS — E1169 Type 2 diabetes mellitus with other specified complication: Secondary | ICD-10-CM | POA: Diagnosis not present

## 2023-10-31 DIAGNOSIS — R030 Elevated blood-pressure reading, without diagnosis of hypertension: Secondary | ICD-10-CM | POA: Diagnosis not present

## 2023-11-06 DIAGNOSIS — G809 Cerebral palsy, unspecified: Secondary | ICD-10-CM | POA: Diagnosis not present

## 2023-11-06 DIAGNOSIS — G8250 Quadriplegia, unspecified: Secondary | ICD-10-CM | POA: Diagnosis not present

## 2023-11-16 DIAGNOSIS — G4733 Obstructive sleep apnea (adult) (pediatric): Secondary | ICD-10-CM | POA: Diagnosis not present

## 2023-11-23 DIAGNOSIS — G800 Spastic quadriplegic cerebral palsy: Secondary | ICD-10-CM | POA: Diagnosis not present

## 2023-12-06 DIAGNOSIS — G8250 Quadriplegia, unspecified: Secondary | ICD-10-CM | POA: Diagnosis not present

## 2023-12-06 DIAGNOSIS — G809 Cerebral palsy, unspecified: Secondary | ICD-10-CM | POA: Diagnosis not present

## 2024-01-04 DIAGNOSIS — G8250 Quadriplegia, unspecified: Secondary | ICD-10-CM | POA: Diagnosis not present

## 2024-01-04 DIAGNOSIS — G809 Cerebral palsy, unspecified: Secondary | ICD-10-CM | POA: Diagnosis not present

## 2024-01-05 DIAGNOSIS — G8250 Quadriplegia, unspecified: Secondary | ICD-10-CM | POA: Diagnosis not present

## 2024-01-05 DIAGNOSIS — G809 Cerebral palsy, unspecified: Secondary | ICD-10-CM | POA: Diagnosis not present

## 2024-01-15 DIAGNOSIS — G8250 Quadriplegia, unspecified: Secondary | ICD-10-CM | POA: Diagnosis not present

## 2024-01-16 DIAGNOSIS — G4733 Obstructive sleep apnea (adult) (pediatric): Secondary | ICD-10-CM | POA: Diagnosis not present
